# Patient Record
Sex: Male | Born: 1980 | Marital: Married | Smoking: Former smoker
Health system: Southern US, Community
[De-identification: ages and names within clinical notes are randomized; demographics above are authoritative.]

## PROBLEM LIST (undated history)

## (undated) DIAGNOSIS — T7840XA Allergy, unspecified, initial encounter: Secondary | ICD-10-CM

## (undated) HISTORY — PX: FRACTURE SURGERY: SHX138

## (undated) HISTORY — DX: Allergy, unspecified, initial encounter: T78.40XA

---

## 2010-07-10 ENCOUNTER — Ambulatory Visit (INDEPENDENT_AMBULATORY_CARE_PROVIDER_SITE_OTHER): Payer: 59 | Admitting: Family Medicine

## 2010-07-10 ENCOUNTER — Encounter: Payer: Self-pay | Admitting: Family Medicine

## 2010-07-10 VITALS — BP 118/80 | HR 80 | Temp 98.3°F | Ht 69.0 in | Wt 223.1 lb

## 2010-07-10 DIAGNOSIS — N469 Male infertility, unspecified: Secondary | ICD-10-CM

## 2010-07-10 NOTE — Assessment & Plan Note (Signed)
Refer to uro.  Can use OTC tx for rash.

## 2010-07-10 NOTE — Patient Instructions (Signed)
See Shirlee Limerick about your referral before your leave today. I would get a flu shot each fall.   Take care. Glad to see you today.

## 2010-07-10 NOTE — Progress Notes (Signed)
Feeling well except for occ flare of allergy sx and occ lower back pain.  Here for infertility discussion, w/u.  Wife off OCPs for 5 years.  No h/o testicle/uro disease per patient.  No h/o STD.  No pain.  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   PMH and SH reviewed  Meds, vitals, and allergies reviewed.   ROS: See HPI.  Otherwise negative.    GEN: nad, alert and oriented HEENT: mucous membranes moist, tm wnl, op wnl NECK: supple w/o LA CV: rrr. PULM: ctab, no inc wob ABD: soft, +bs EXT: no edema SKIN: no acute rash Testes bilaterally descended without nodularity, tenderness or masses. No scrotal masses or lesions. No penis lesions or urethral discharge. No hernia.  Mild B fungal inguinal rash noted

## 2011-10-24 ENCOUNTER — Encounter: Payer: Self-pay | Admitting: Family Medicine

## 2011-10-24 ENCOUNTER — Ambulatory Visit (INDEPENDENT_AMBULATORY_CARE_PROVIDER_SITE_OTHER): Payer: 59 | Admitting: Family Medicine

## 2011-10-24 VITALS — BP 118/74 | HR 60 | Temp 98.3°F | Wt 194.5 lb

## 2011-10-24 DIAGNOSIS — M542 Cervicalgia: Secondary | ICD-10-CM

## 2011-10-24 MED ORDER — PREDNISONE 5 MG PO KIT: PACK | ORAL | Status: DC

## 2011-10-24 NOTE — Patient Instructions (Addendum)
Take the prednisone with food.  If not improved, or if the pain returns, then call and we'll set up the MRI and referral.   Don't take aspirin, aleve, ibuprofen, etc with the prednisone.

## 2011-10-24 NOTE — Progress Notes (Signed)
Neck pain.  Constant 4-6/10 neck pain, minimum.  Some days are worse than others.  Tried heating pads, ice.  Taking ibuprofen w/o much help.  He doesn't remember the last day he didn't have neck pain.  No clear trigger to start the sx years ago.  Occ radiation down into the hands, L>R.  Occ numbness in the hands. No leg sx.  No weakness.  No gait changes, no foot drop.   He was concerned because father had similar and did well after some type of surgery.   Meds, vitals, and allergies reviewed.   ROS: See HPI.  Otherwise, noncontributory.  nad ncat Tm wnl  Nasal and OP exam wnl Neck supple, no LA, no midline pain.  CN 2-12 wnl B, S/S/DTR wnl x4 except for change in sensation on L hand that may be due to prev fracture- this is an old finding.  L paraspinal C spine muscles ttp w/o rash

## 2011-10-25 DIAGNOSIS — M542 Cervicalgia: Secondary | ICD-10-CM | POA: Insufficient documentation

## 2011-10-25 NOTE — Assessment & Plan Note (Signed)
D/w pt about options. Would try steroid taper with GI caution and if not improved then check MRI and refer to spine clinic.  He agrees. No emergent sx.  Okay for outpatient f/u.

## 2012-09-08 ENCOUNTER — Ambulatory Visit (INDEPENDENT_AMBULATORY_CARE_PROVIDER_SITE_OTHER)
Admission: RE | Admit: 2012-09-08 | Discharge: 2012-09-08 | Disposition: A | Discharge status: Home / Self Care | Payer: 59 | Source: Ambulatory Visit | Attending: Family Medicine | Admitting: Family Medicine

## 2012-09-08 ENCOUNTER — Encounter: Payer: Self-pay | Admitting: Family Medicine

## 2012-09-08 ENCOUNTER — Ambulatory Visit (INDEPENDENT_AMBULATORY_CARE_PROVIDER_SITE_OTHER): Payer: 59 | Admitting: Family Medicine

## 2012-09-08 VITALS — BP 110/84 | HR 68 | Temp 98.3°F | Wt 200.8 lb

## 2012-09-08 DIAGNOSIS — M542 Cervicalgia: Secondary | ICD-10-CM

## 2012-09-08 NOTE — Progress Notes (Signed)
R sided neck pain. He had had neck pain prev.  He was on steroid taper prev, unclear if that had some effect per patient recollection.  He started going for massages and "knots" had been noted in the neck.  Neck feels tight. Some better today.  This episode has been going on for a few weeks.    He's had changes in sensation in L forearm that are old, ie not new.  He does have pain into the R arm.  "lightning bolt" into the R arm, with certain movements.  No trauma.  No leg sx.  Grip is wnl on L hand, wnl on R hand except for occ triggering of 2nd finger.  No FCNAVD.  No HA.  No weakness in R arm but sometimes pain limits ROM.  No rash.    Tx: massage, heat, limited med use. Muscle relaxers didn't help prev  Meds, vitals, and allergies reviewed.   ROS: See HPI.  Otherwise, noncontributory.  nad ncat Mmm Neck supple but some pain on R paraspinal muscles with ROM No midline pain No LA spurlings neg B NV intact in BUE except for old finding on L forearm No weakness in BUE

## 2012-09-08 NOTE — Patient Instructions (Addendum)
Go to the lab on the way out.  We'll contact you with your xray report. Take OTC ibuprofen with food for now and let me know if you don't improve.  Take care.

## 2012-09-09 NOTE — Assessment & Plan Note (Signed)
Ibuprofen trial for now with GI caution.  If not improved, we may need to proceed with MR C spine. Discussed. He agrees. Okay for outpatient f/u.  No weakness.

## 2012-09-11 ENCOUNTER — Telehealth: Payer: Self-pay | Admitting: *Deleted

## 2012-09-11 DIAGNOSIS — M542 Cervicalgia: Secondary | ICD-10-CM

## 2012-09-11 NOTE — Telephone Encounter (Signed)
On patient's recent x-ray result, he was advised that if there is no improvement, to proceed with MRI.  He has phoned in and left a message that he would like to proceed with this testing.  Please advise.

## 2012-09-14 NOTE — Telephone Encounter (Signed)
Order placed. Thanks.

## 2012-09-24 ENCOUNTER — Ambulatory Visit
Admission: RE | Admit: 2012-09-24 | Discharge: 2012-09-24 | Disposition: A | Discharge status: Home / Self Care | Payer: 59 | Source: Ambulatory Visit | Attending: Family Medicine | Admitting: Family Medicine

## 2012-09-24 ENCOUNTER — Other Ambulatory Visit: Payer: Self-pay | Admitting: Family Medicine

## 2012-09-24 DIAGNOSIS — M542 Cervicalgia: Secondary | ICD-10-CM

## 2013-01-15 ENCOUNTER — Other Ambulatory Visit: Payer: Self-pay

## 2013-06-03 ENCOUNTER — Ambulatory Visit (INDEPENDENT_AMBULATORY_CARE_PROVIDER_SITE_OTHER): Payer: 59 | Admitting: Family Medicine

## 2013-06-03 ENCOUNTER — Encounter: Payer: Self-pay | Admitting: Family Medicine

## 2013-06-03 VITALS — BP 132/94 | HR 68 | Temp 97.6°F | Wt 200.2 lb

## 2013-06-03 DIAGNOSIS — R030 Elevated blood-pressure reading, without diagnosis of hypertension: Secondary | ICD-10-CM

## 2013-06-03 DIAGNOSIS — IMO0001 Reserved for inherently not codable concepts without codable children: Secondary | ICD-10-CM | POA: Insufficient documentation

## 2013-06-03 NOTE — Progress Notes (Signed)
Pre visit review using our clinic review tool, if applicable. No additional management support is needed unless otherwise documented below in the visit note.  Was at the dental clinic, had an infected wisdom tooth.  Had his wisdom teeth out in the meantime.  Pressure was "up", not recalled exactly.  Possibly 140s systolic.  He was in pain at the time.  No h/o HTN.  No CP, SOB, BLE edema.  D/w pt about salt load, had been having to skip meals due to schedule at work.  Never on BP medicine.    Meds, vitals, and allergies reviewed.   ROS: See HPI.  Otherwise, noncontributory.  GEN: nad, alert and oriented HEENT: mucous membranes moist NECK: supple w/o LA CV: rrr PULM: ctab, no inc wob ABD: soft, +bs EXT: no edema SKIN: no acute rash

## 2013-06-03 NOTE — Patient Instructions (Signed)
Look up the DASH diet and [email or web address]. Look at low salt diets.   Try to drink more water.  Work on gradual weigh loss. Check your BP a few times. If consistently >140/>90, then notify me.   Take care.  Glad to see you.

## 2013-06-03 NOTE — Assessment & Plan Note (Signed)
W/o formal dx HTN.  D/w pt.  DASH diet, work on weight and notify me if BP continues to be >140/>90.  He agrees.

## 2014-04-07 IMAGING — CR DG CERVICAL SPINE COMPLETE 4+V
5 series · 5 of 5 positions shown · non-contrast
Comparison: None.

CLINICAL DATA: Neck pain.

CERVICAL SPINE - COMPLETE 4+ VIEW

[view not recorded (1 of 5)]
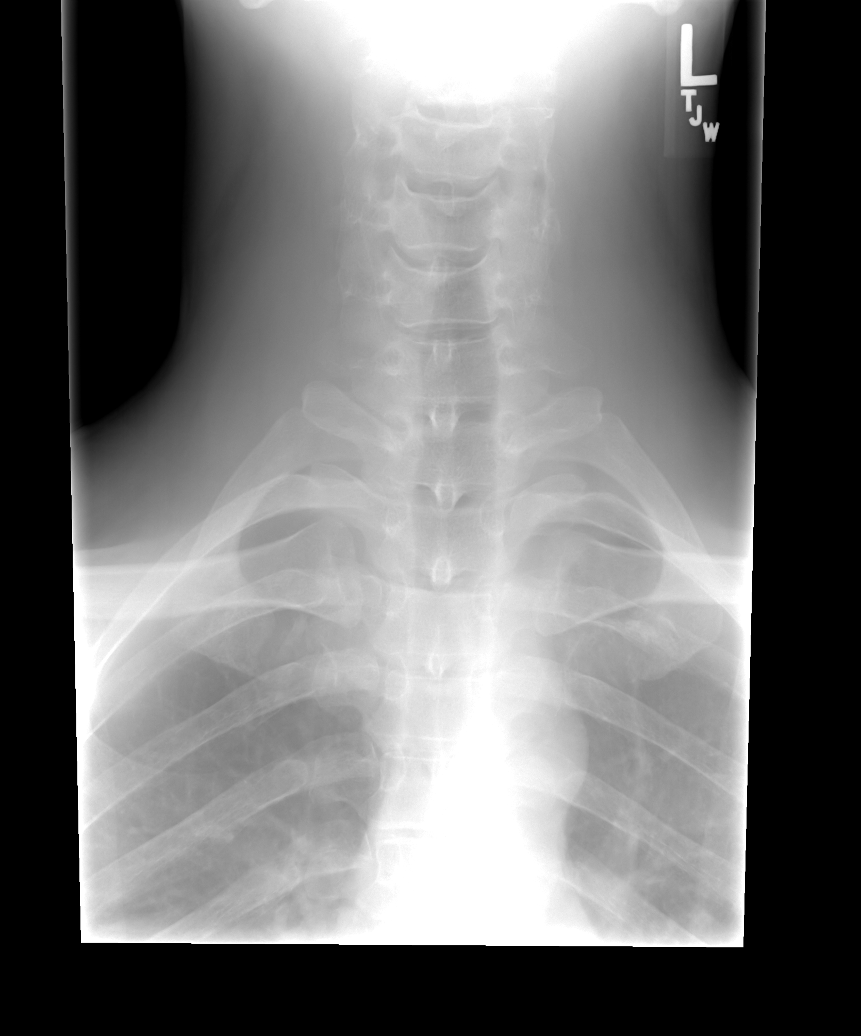

[view not recorded (2 of 5)]
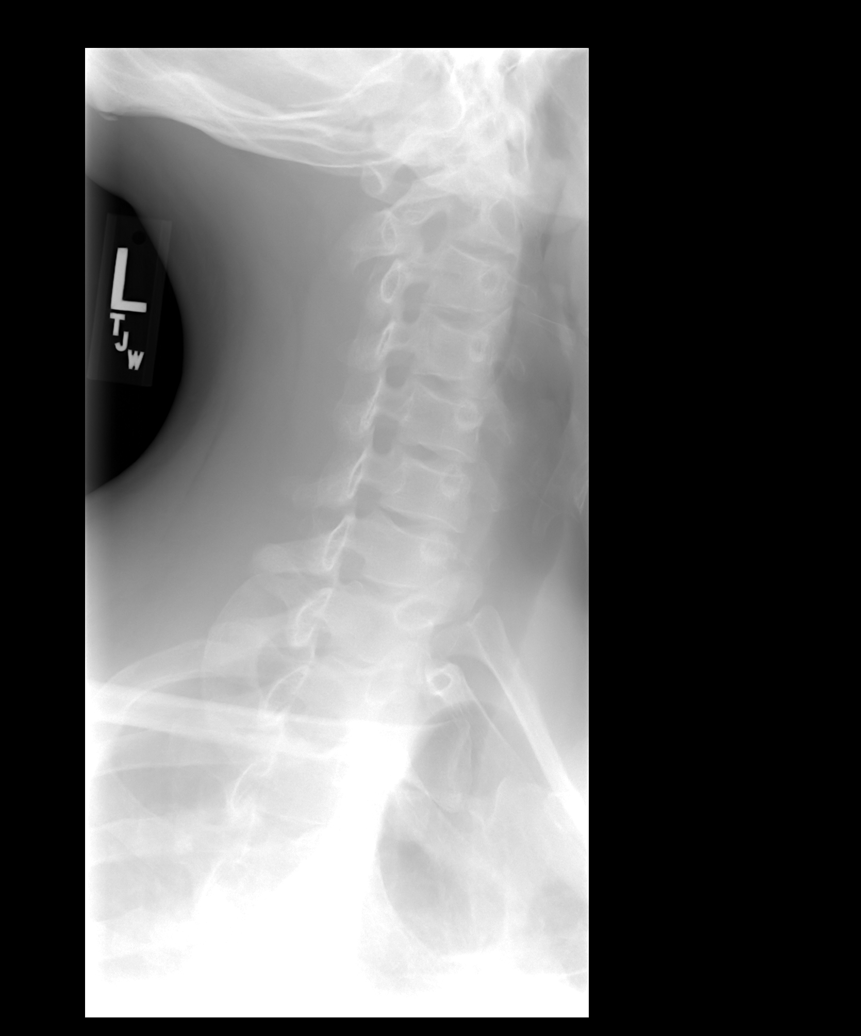

[view not recorded (3 of 5)]
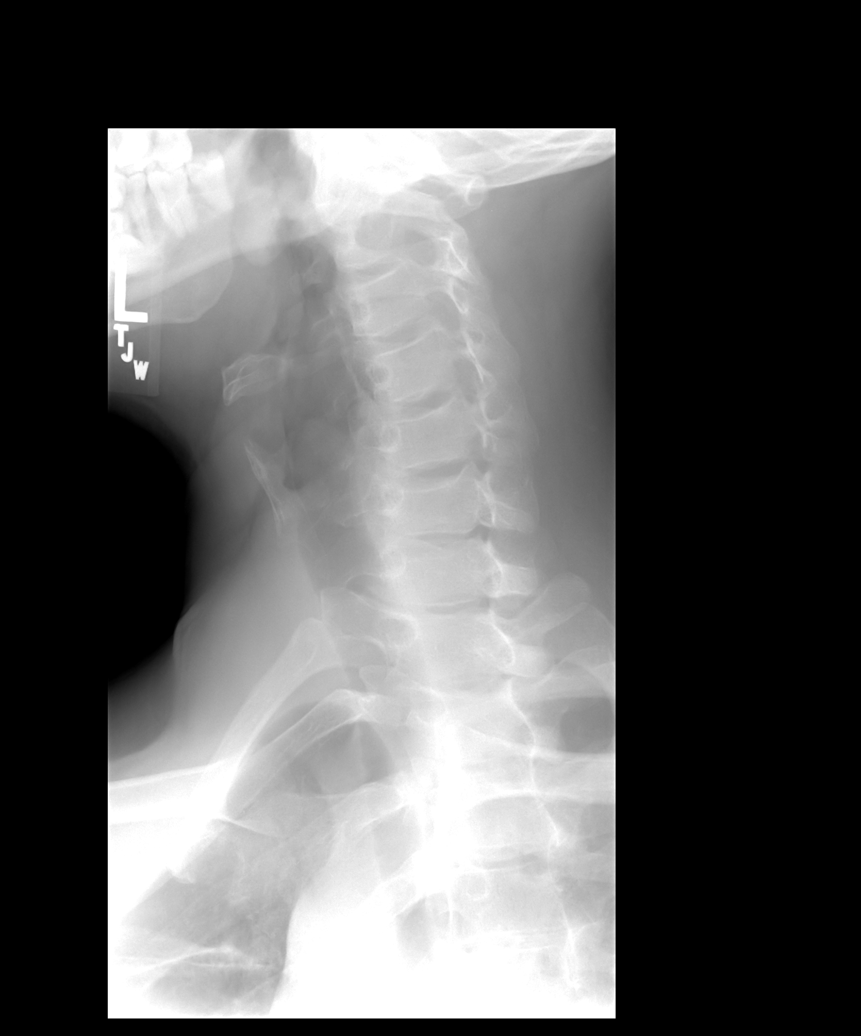

[view not recorded (4 of 5)]
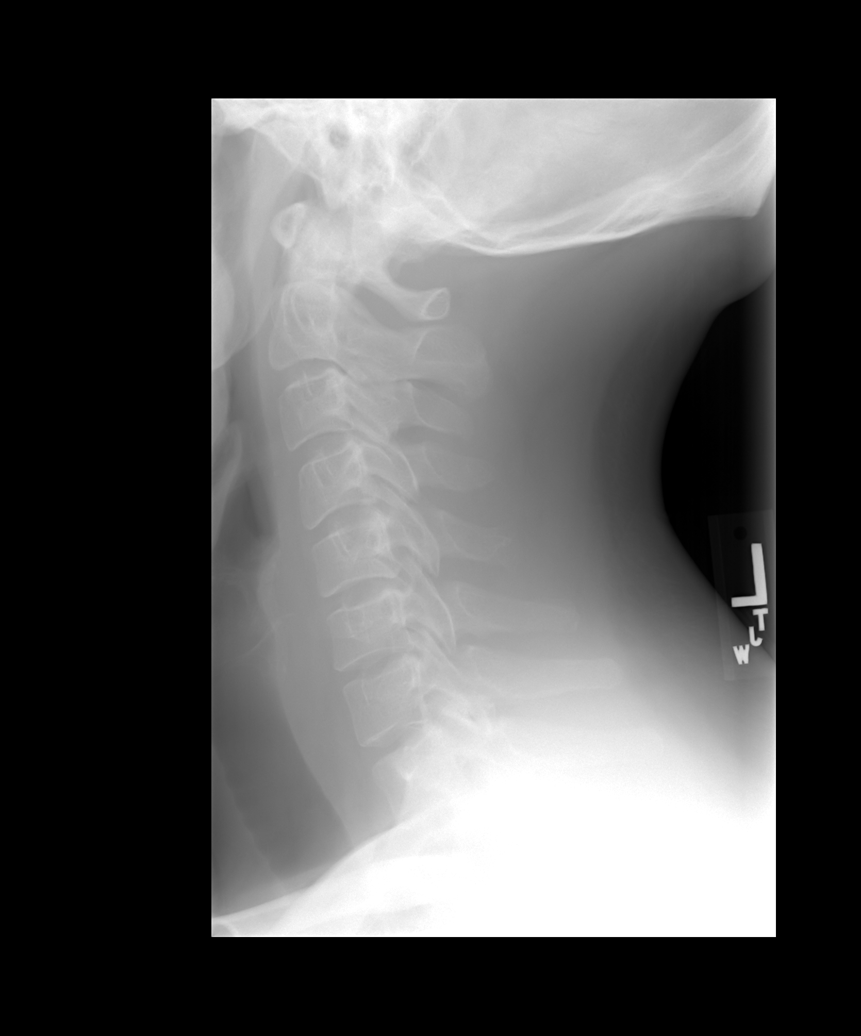

[view not recorded (5 of 5)]
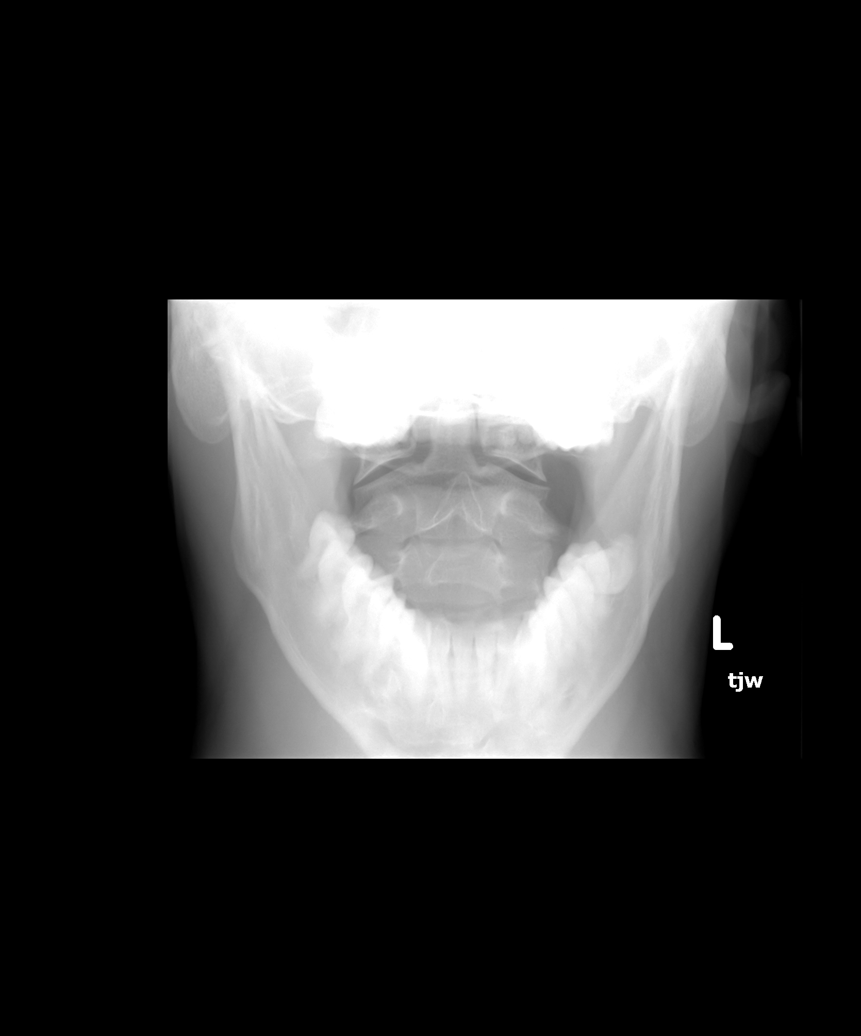

[5 of 5 positions shown; findings below may reference images not displayed]

FINDINGS: There is straightening of the normal cervical lordosis
without subluxation or fracture.  Vertebral body height is
maintained.  There may be minimal loss of disc space height with
uncovertebral hypertrophy at C6-7.  Suspect facet sclerosis at this
level as well.  Prevertebral soft tissues are within normal limits.
Left neural foramina are patent.  Right neural foramina are poorly
visualized due to over rotation.  Visualized lung apices show no
acute findings.
IMPRESSION: 1.  Straightening of the normal cervical lordosis without
subluxation or fracture.
2.  Suspect minimal spondylosis at C6-7.
